# Patient Record
Sex: Female | Born: 1945 | Race: White | Hispanic: No | Smoking: Former smoker
Health system: Southern US, Community
[De-identification: ages and names within clinical notes are randomized; demographics above are authoritative.]

## PROBLEM LIST (undated history)

## (undated) DIAGNOSIS — I10 Essential (primary) hypertension: Secondary | ICD-10-CM

## (undated) DIAGNOSIS — E119 Type 2 diabetes mellitus without complications: Secondary | ICD-10-CM

## (undated) DIAGNOSIS — G4730 Sleep apnea, unspecified: Secondary | ICD-10-CM

## (undated) DIAGNOSIS — C801 Malignant (primary) neoplasm, unspecified: Secondary | ICD-10-CM

## (undated) DIAGNOSIS — T8859XA Other complications of anesthesia, initial encounter: Secondary | ICD-10-CM

## (undated) DIAGNOSIS — M659 Unspecified synovitis and tenosynovitis, unspecified site: Secondary | ICD-10-CM

## (undated) DIAGNOSIS — Z972 Presence of dental prosthetic device (complete) (partial): Secondary | ICD-10-CM

## (undated) DIAGNOSIS — R42 Dizziness and giddiness: Secondary | ICD-10-CM

## (undated) DIAGNOSIS — M1990 Unspecified osteoarthritis, unspecified site: Secondary | ICD-10-CM

## (undated) DIAGNOSIS — I4891 Unspecified atrial fibrillation: Secondary | ICD-10-CM

## (undated) DIAGNOSIS — E781 Pure hyperglyceridemia: Secondary | ICD-10-CM

## (undated) DIAGNOSIS — T4145XA Adverse effect of unspecified anesthetic, initial encounter: Secondary | ICD-10-CM

---
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
## 2004-02-08 ENCOUNTER — Ambulatory Visit: Payer: Self-pay | Admitting: Gynecology

## 2004-04-29 ENCOUNTER — Ambulatory Visit: Payer: Self-pay | Admitting: Family Medicine

## 2005-05-05 ENCOUNTER — Ambulatory Visit: Payer: Self-pay | Admitting: Family Medicine

## 2006-05-11 ENCOUNTER — Ambulatory Visit: Payer: Self-pay | Admitting: Family Medicine

## 2007-05-13 ENCOUNTER — Ambulatory Visit: Payer: Self-pay | Admitting: Family Medicine

## 2007-06-05 ENCOUNTER — Ambulatory Visit: Payer: Self-pay | Admitting: Family Medicine

## 2008-05-15 ENCOUNTER — Ambulatory Visit: Payer: Self-pay | Admitting: Family Medicine

## 2009-07-26 ENCOUNTER — Ambulatory Visit: Payer: Self-pay | Admitting: Family Medicine

## 2010-07-30 ENCOUNTER — Ambulatory Visit: Payer: Self-pay | Admitting: Family Medicine

## 2011-08-01 ENCOUNTER — Ambulatory Visit: Payer: Self-pay | Admitting: Family Medicine

## 2012-08-03 ENCOUNTER — Ambulatory Visit: Payer: Self-pay | Admitting: Family Medicine

## 2012-09-08 ENCOUNTER — Ambulatory Visit: Payer: Self-pay | Admitting: Specialist

## 2012-09-08 LAB — APTT: Activated PTT: 30 secs (ref 23.6–35.9)

## 2012-09-08 LAB — IRON AND TIBC
Iron Bind.Cap.(Total): 422 ug/dL (ref 250–450)
Unbound Iron-Bind.Cap.: 367 ug/dL

## 2012-09-08 LAB — CBC WITH DIFFERENTIAL/PLATELET
Basophil #: 0.1 10*3/uL (ref 0.0–0.1)
Eosinophil %: 2.8 %
HCT: 37 % (ref 35.0–47.0)
Lymphocyte #: 1.4 10*3/uL (ref 1.0–3.6)
Lymphocyte %: 21.8 %
MCH: 29.6 pg (ref 26.0–34.0)
MCHC: 33.2 g/dL (ref 32.0–36.0)
Monocyte %: 7.7 %
Neutrophil #: 4.4 10*3/uL (ref 1.4–6.5)
Neutrophil %: 66.9 %
WBC: 6.6 10*3/uL (ref 3.6–11.0)

## 2012-09-08 LAB — COMPREHENSIVE METABOLIC PANEL
Albumin: 3.8 g/dL (ref 3.4–5.0)
Alkaline Phosphatase: 63 U/L (ref 50–136)
Anion Gap: 11 (ref 7–16)
BUN: 15 mg/dL (ref 7–18)
Chloride: 104 mmol/L (ref 98–107)
Creatinine: 0.72 mg/dL (ref 0.60–1.30)
EGFR (Non-African Amer.): >60
SGPT (ALT): 24 U/L (ref 12–78)
Total Protein: 8.2 g/dL (ref 6.4–8.2)

## 2012-09-08 LAB — PROTIME-INR
INR: 1
Prothrombin Time: 13.3 secs (ref 11.5–14.7)

## 2012-09-08 LAB — LIPASE, BLOOD: Lipase: 135 U/L (ref 73–393)

## 2012-09-08 LAB — HEMOGLOBIN A1C: Hemoglobin A1C: 5.9 % (ref 4.2–6.3)

## 2012-09-08 LAB — MAGNESIUM: Magnesium: 2.1 mg/dL

## 2012-09-08 LAB — AMYLASE: Amylase: 17 U/L — ABNORMAL LOW (ref 25–115)

## 2012-09-08 LAB — TSH: Thyroid Stimulating Horm: 2.29 u[IU]/mL

## 2012-09-08 LAB — FOLATE: Folic Acid: 16.1 ng/mL (ref 3.1–100.0)

## 2012-10-12 ENCOUNTER — Ambulatory Visit: Payer: Self-pay | Admitting: Specialist

## 2012-11-03 ENCOUNTER — Ambulatory Visit: Payer: Self-pay | Admitting: Specialist

## 2012-12-30 ENCOUNTER — Ambulatory Visit: Payer: Self-pay | Admitting: Specialist

## 2013-01-24 ENCOUNTER — Ambulatory Visit: Payer: Self-pay | Admitting: Specialist

## 2013-02-24 HISTORY — PX: LAPAROSCOPIC GASTRIC RESECTION: SHX1936

## 2013-08-16 ENCOUNTER — Ambulatory Visit: Payer: Self-pay | Admitting: Family Medicine

## 2014-09-20 ENCOUNTER — Other Ambulatory Visit: Payer: Self-pay | Admitting: Family Medicine

## 2014-09-20 DIAGNOSIS — Z1231 Encounter for screening mammogram for malignant neoplasm of breast: Secondary | ICD-10-CM

## 2014-10-03 ENCOUNTER — Ambulatory Visit
Admission: RE | Admit: 2014-10-03 | Discharge: 2014-10-03 | Disposition: A | Discharge status: Home / Self Care | Payer: Medicare Other | Source: Ambulatory Visit | Attending: Family Medicine | Admitting: Family Medicine

## 2014-10-03 DIAGNOSIS — Z1231 Encounter for screening mammogram for malignant neoplasm of breast: Secondary | ICD-10-CM | POA: Insufficient documentation

## 2015-03-27 ENCOUNTER — Other Ambulatory Visit: Payer: Self-pay | Admitting: Obstetrics and Gynecology

## 2015-03-27 DIAGNOSIS — N6320 Unspecified lump in the left breast, unspecified quadrant: Secondary | ICD-10-CM

## 2015-03-27 DIAGNOSIS — N63 Unspecified lump in unspecified breast: Secondary | ICD-10-CM

## 2015-03-29 ENCOUNTER — Ambulatory Visit
Admission: RE | Admit: 2015-03-29 | Discharge: 2015-03-29 | Disposition: A | Discharge status: Home / Self Care | Payer: Medicare Other | Source: Ambulatory Visit | Attending: Obstetrics and Gynecology | Admitting: Obstetrics and Gynecology

## 2015-03-29 ENCOUNTER — Other Ambulatory Visit: Payer: Self-pay | Admitting: Obstetrics and Gynecology

## 2015-03-29 DIAGNOSIS — N63 Unspecified lump in breast: Secondary | ICD-10-CM | POA: Diagnosis present

## 2015-03-29 DIAGNOSIS — N6320 Unspecified lump in the left breast, unspecified quadrant: Secondary | ICD-10-CM

## 2015-03-29 DIAGNOSIS — R928 Other abnormal and inconclusive findings on diagnostic imaging of breast: Secondary | ICD-10-CM | POA: Diagnosis not present

## 2015-08-24 ENCOUNTER — Other Ambulatory Visit: Payer: Self-pay | Admitting: Family Medicine

## 2015-08-24 DIAGNOSIS — Z1231 Encounter for screening mammogram for malignant neoplasm of breast: Secondary | ICD-10-CM

## 2015-10-08 ENCOUNTER — Ambulatory Visit: Payer: Medicare Other

## 2015-10-09 ENCOUNTER — Ambulatory Visit
Admission: RE | Admit: 2015-10-09 | Discharge: 2015-10-09 | Disposition: A | Discharge status: Home / Self Care | Payer: Medicare Other | Source: Ambulatory Visit | Attending: Family Medicine | Admitting: Family Medicine

## 2015-10-09 ENCOUNTER — Other Ambulatory Visit: Payer: Self-pay | Admitting: Family Medicine

## 2015-10-09 DIAGNOSIS — Z1231 Encounter for screening mammogram for malignant neoplasm of breast: Secondary | ICD-10-CM | POA: Insufficient documentation

## 2016-08-29 ENCOUNTER — Other Ambulatory Visit: Payer: Self-pay | Admitting: Family Medicine

## 2016-08-29 DIAGNOSIS — Z1231 Encounter for screening mammogram for malignant neoplasm of breast: Secondary | ICD-10-CM

## 2016-10-13 ENCOUNTER — Ambulatory Visit
Admission: RE | Admit: 2016-10-13 | Discharge: 2016-10-13 | Disposition: A | Discharge status: Home / Self Care | Payer: Medicare Other | Source: Ambulatory Visit | Attending: Family Medicine | Admitting: Family Medicine

## 2016-10-13 DIAGNOSIS — Z1231 Encounter for screening mammogram for malignant neoplasm of breast: Secondary | ICD-10-CM

## 2017-11-18 ENCOUNTER — Other Ambulatory Visit: Payer: Self-pay | Admitting: Family Medicine

## 2017-11-18 DIAGNOSIS — Z1231 Encounter for screening mammogram for malignant neoplasm of breast: Secondary | ICD-10-CM

## 2017-12-28 ENCOUNTER — Ambulatory Visit
Admission: RE | Admit: 2017-12-28 | Discharge: 2017-12-28 | Disposition: A | Discharge status: Home / Self Care | Payer: Medicare Other | Source: Ambulatory Visit | Attending: Family Medicine | Admitting: Family Medicine

## 2017-12-28 DIAGNOSIS — Z1231 Encounter for screening mammogram for malignant neoplasm of breast: Secondary | ICD-10-CM | POA: Insufficient documentation

## 2017-12-28 HISTORY — DX: Malignant (primary) neoplasm, unspecified: C80.1

## 2018-01-08 ENCOUNTER — Other Ambulatory Visit: Payer: Self-pay | Admitting: Unknown Physician Specialty

## 2018-01-08 DIAGNOSIS — E119 Type 2 diabetes mellitus without complications: Secondary | ICD-10-CM

## 2018-01-08 DIAGNOSIS — M6530 Trigger finger, unspecified finger: Secondary | ICD-10-CM

## 2018-02-02 ENCOUNTER — Other Ambulatory Visit: Payer: Self-pay | Admitting: Unknown Physician Specialty

## 2018-02-02 ENCOUNTER — Encounter (INDEPENDENT_AMBULATORY_CARE_PROVIDER_SITE_OTHER): Payer: Self-pay

## 2018-02-02 ENCOUNTER — Other Ambulatory Visit
Admission: RE | Admit: 2018-02-02 | Discharge: 2018-02-02 | Disposition: A | Discharge status: Home / Self Care | Payer: Medicare Other | Source: Home / Self Care | Attending: Unknown Physician Specialty | Admitting: Unknown Physician Specialty

## 2018-02-02 ENCOUNTER — Ambulatory Visit
Admission: RE | Admit: 2018-02-02 | Discharge: 2018-02-02 | Disposition: A | Discharge status: Home / Self Care | Payer: Medicare Other | Source: Ambulatory Visit | Attending: Unknown Physician Specialty | Admitting: Unknown Physician Specialty

## 2018-02-02 DIAGNOSIS — E119 Type 2 diabetes mellitus without complications: Secondary | ICD-10-CM

## 2018-02-02 DIAGNOSIS — M19042 Primary osteoarthritis, left hand: Secondary | ICD-10-CM | POA: Diagnosis not present

## 2018-02-02 DIAGNOSIS — M6530 Trigger finger, unspecified finger: Secondary | ICD-10-CM | POA: Diagnosis present

## 2018-02-02 LAB — CREATININE, SERUM: Creatinine, Ser: 0.48 mg/dL (ref 0.44–1.00)

## 2018-02-02 LAB — BUN: BUN: 15 mg/dL (ref 8–23)

## 2019-02-03 ENCOUNTER — Other Ambulatory Visit: Payer: Self-pay | Admitting: Family Medicine

## 2019-02-03 DIAGNOSIS — Z1231 Encounter for screening mammogram for malignant neoplasm of breast: Secondary | ICD-10-CM

## 2019-02-09 ENCOUNTER — Ambulatory Visit
Admission: RE | Admit: 2019-02-09 | Discharge: 2019-02-09 | Disposition: A | Discharge status: Home / Self Care | Payer: Medicare Other | Source: Ambulatory Visit | Attending: Family Medicine | Admitting: Family Medicine

## 2019-02-09 ENCOUNTER — Other Ambulatory Visit: Payer: Self-pay

## 2019-02-09 DIAGNOSIS — Z1231 Encounter for screening mammogram for malignant neoplasm of breast: Secondary | ICD-10-CM | POA: Insufficient documentation

## 2019-09-20 ENCOUNTER — Encounter: Payer: Self-pay | Admitting: Ophthalmology

## 2019-09-20 ENCOUNTER — Other Ambulatory Visit: Payer: Self-pay

## 2019-09-22 ENCOUNTER — Other Ambulatory Visit
Admission: RE | Admit: 2019-09-22 | Discharge: 2019-09-22 | Disposition: A | Discharge status: Home / Self Care | Payer: Medicare Other | Source: Ambulatory Visit | Attending: Ophthalmology | Admitting: Ophthalmology

## 2019-09-22 ENCOUNTER — Other Ambulatory Visit: Payer: Self-pay

## 2019-09-22 DIAGNOSIS — Z01812 Encounter for preprocedural laboratory examination: Secondary | ICD-10-CM | POA: Diagnosis present

## 2019-09-22 DIAGNOSIS — Z20822 Contact with and (suspected) exposure to covid-19: Secondary | ICD-10-CM | POA: Insufficient documentation

## 2019-09-22 LAB — SARS CORONAVIRUS 2 (TAT 6-24 HRS): SARS Coronavirus 2: NEGATIVE

## 2019-09-22 NOTE — Discharge Instructions (Signed)

## 2019-09-26 ENCOUNTER — Ambulatory Visit: Payer: Medicare Other | Admitting: Anesthesiology

## 2019-09-26 ENCOUNTER — Encounter: Payer: Self-pay | Admitting: Ophthalmology

## 2019-09-26 ENCOUNTER — Other Ambulatory Visit: Payer: Self-pay

## 2019-09-26 ENCOUNTER — Encounter
Admission: RE | Disposition: A | Discharge status: Home / Self Care | Payer: Self-pay | Source: Home / Self Care | Attending: Ophthalmology

## 2019-09-26 ENCOUNTER — Ambulatory Visit
Admission: RE | Admit: 2019-09-26 | Discharge: 2019-09-26 | Disposition: A | Discharge status: Home / Self Care | Payer: Medicare Other | Attending: Ophthalmology | Admitting: Ophthalmology

## 2019-09-26 DIAGNOSIS — Z79899 Other long term (current) drug therapy: Secondary | ICD-10-CM | POA: Diagnosis not present

## 2019-09-26 DIAGNOSIS — I480 Paroxysmal atrial fibrillation: Secondary | ICD-10-CM | POA: Diagnosis not present

## 2019-09-26 DIAGNOSIS — Z7984 Long term (current) use of oral hypoglycemic drugs: Secondary | ICD-10-CM | POA: Insufficient documentation

## 2019-09-26 DIAGNOSIS — I10 Essential (primary) hypertension: Secondary | ICD-10-CM | POA: Insufficient documentation

## 2019-09-26 DIAGNOSIS — E1136 Type 2 diabetes mellitus with diabetic cataract: Secondary | ICD-10-CM | POA: Insufficient documentation

## 2019-09-26 DIAGNOSIS — G4730 Sleep apnea, unspecified: Secondary | ICD-10-CM | POA: Diagnosis not present

## 2019-09-26 DIAGNOSIS — H2511 Age-related nuclear cataract, right eye: Secondary | ICD-10-CM | POA: Insufficient documentation

## 2019-09-26 DIAGNOSIS — Z7901 Long term (current) use of anticoagulants: Secondary | ICD-10-CM | POA: Insufficient documentation

## 2019-09-26 HISTORY — DX: Type 2 diabetes mellitus without complications: E11.9

## 2019-09-26 HISTORY — DX: Other complications of anesthesia, initial encounter: T88.59XA

## 2019-09-26 HISTORY — DX: Unspecified osteoarthritis, unspecified site: M19.90

## 2019-09-26 HISTORY — DX: Presence of dental prosthetic device (complete) (partial): Z97.2

## 2019-09-26 HISTORY — DX: Pure hyperglyceridemia: E78.1

## 2019-09-26 HISTORY — DX: Unspecified atrial fibrillation: I48.91

## 2019-09-26 HISTORY — PX: CATARACT EXTRACTION W/PHACO: SHX586

## 2019-09-26 HISTORY — DX: Sleep apnea, unspecified: G47.30

## 2019-09-26 HISTORY — DX: Unspecified synovitis and tenosynovitis, unspecified site: M65.90

## 2019-09-26 HISTORY — DX: Dizziness and giddiness: R42

## 2019-09-26 HISTORY — DX: Essential (primary) hypertension: I10

## 2019-09-26 HISTORY — DX: Synovitis and tenosynovitis, unspecified: M65.9

## 2019-09-26 LAB — GLUCOSE, CAPILLARY
Glucose-Capillary: 121 mg/dL — ABNORMAL HIGH (ref 70–99)
Glucose-Capillary: 113 mg/dL — ABNORMAL HIGH (ref 70–99)

## 2019-09-26 SURGERY — PHACOEMULSIFICATION, CATARACT, WITH IOL INSERTION
Anesthesia: Monitor Anesthesia Care | Site: Eye | Laterality: Right

## 2019-09-26 MED ORDER — MIDAZOLAM HCL 2 MG/2ML IJ SOLN
INTRAMUSCULAR | Status: DC | PRN
  Administered 2019-09-26: 1 mg via INTRAVENOUS

## 2019-09-26 MED ORDER — ACETAMINOPHEN 160 MG/5ML PO SOLN: 325.0000 mg | ORAL | Status: DC | PRN

## 2019-09-26 MED ORDER — LIDOCAINE HCL (PF) 2 % IJ SOLN
INTRAOCULAR | Status: DC | PRN
  Administered 2019-09-26: 1 mL via INTRAOCULAR

## 2019-09-26 MED ORDER — ACETAMINOPHEN 325 MG PO TABS: 650.0000 mg | ORAL_TABLET | Freq: Once | ORAL | Status: DC | PRN

## 2019-09-26 MED ORDER — TETRACAINE HCL 0.5 % OP SOLN
1.0000 [drp] | OPHTHALMIC | Status: DC | PRN
  Administered 2019-09-26 (×3): 1 [drp] via OPHTHALMIC

## 2019-09-26 MED ORDER — EPINEPHRINE PF 1 MG/ML IJ SOLN
INTRAOCULAR | Status: DC | PRN
  Administered 2019-09-26: 101 mL via OPHTHALMIC

## 2019-09-26 MED ORDER — ONDANSETRON HCL 4 MG/2ML IJ SOLN: 4.0000 mg | Freq: Once | INTRAMUSCULAR | Status: DC | PRN

## 2019-09-26 MED ORDER — MOXIFLOXACIN HCL 0.5 % OP SOLN
OPHTHALMIC | Status: DC | PRN
  Administered 2019-09-26: 0.2 mL via OPHTHALMIC

## 2019-09-26 MED ORDER — FENTANYL CITRATE (PF) 100 MCG/2ML IJ SOLN
INTRAMUSCULAR | Status: DC | PRN
  Administered 2019-09-26: 50 ug via INTRAVENOUS

## 2019-09-26 MED ORDER — ARMC OPHTHALMIC DILATING DROPS
1.0000 "application " | OPHTHALMIC | Status: DC | PRN
  Administered 2019-09-26 (×3): 1 via OPHTHALMIC

## 2019-09-26 MED ORDER — SODIUM HYALURONATE 23 MG/ML IO SOLN
INTRAOCULAR | Status: DC | PRN
  Administered 2019-09-26: 0.6 mL via INTRAOCULAR

## 2019-09-26 MED ORDER — SODIUM HYALURONATE 10 MG/ML IO SOLN
INTRAOCULAR | Status: DC | PRN
  Administered 2019-09-26: 0.55 mL via INTRAOCULAR

## 2019-09-26 MED ORDER — LACTATED RINGERS IV SOLN: INTRAVENOUS | Status: DC

## 2019-09-26 SURGICAL SUPPLY — 19 items
CANNULA ANT/CHMB 27G (MISCELLANEOUS) ×2 IMPLANT
CANNULA ANT/CHMB 27GA (MISCELLANEOUS) ×6 IMPLANT
DISSECTOR HYDRO NUCLEUS 50X22 (MISCELLANEOUS) ×3 IMPLANT
GLOVE SURG LX 7.5 STRW (GLOVE) ×4
GLOVE SURG LX STRL 7.5 STRW (GLOVE) ×1 IMPLANT
GLOVE SURG SYN 8.5  E (GLOVE) ×2
GLOVE SURG SYN 8.5 E (GLOVE) ×1 IMPLANT
GLOVE SURG SYN 8.5 PF PI (GLOVE) ×1 IMPLANT
GOWN STRL REUS W/ TWL LRG LVL3 (GOWN DISPOSABLE) ×2 IMPLANT
GOWN STRL REUS W/TWL LRG LVL3 (GOWN DISPOSABLE) ×6
LENS IOL TECNIS 27.0 (Intraocular Lens) ×2 IMPLANT
MARKER SKIN DUAL TIP RULER LAB (MISCELLANEOUS) ×3 IMPLANT
PACK DR. KING ARMS (PACKS) ×3 IMPLANT
PACK EYE AFTER SURG (MISCELLANEOUS) ×3 IMPLANT
PACK OPTHALMIC (MISCELLANEOUS) ×3 IMPLANT
SYR 3ML LL SCALE MARK (SYRINGE) ×3 IMPLANT
SYR TB 1ML LUER SLIP (SYRINGE) ×3 IMPLANT
WATER STERILE IRR 250ML POUR (IV SOLUTION) ×3 IMPLANT
WIPE NON LINTING 3.25X3.25 (MISCELLANEOUS) ×3 IMPLANT

## 2019-09-26 NOTE — Anesthesia Procedure Notes (Signed)
Procedure Name: MAC Date/Time: 09/26/2019 10:55 AM Performed by: Silvana Newness, CRNA Pre-anesthesia Checklist: Patient identified, Emergency Drugs available, Suction available, Patient being monitored and Timeout performed Patient Re-evaluated:Patient Re-evaluated prior to induction Oxygen Delivery Method: Nasal cannula Placement Confirmation: positive ETCO2

## 2019-09-26 NOTE — Transfer of Care (Signed)
Immediate Anesthesia Transfer of Care Note  Patient: Tina Butler  Procedure(s) Performed: CATARACT EXTRACTION PHACO AND INTRAOCULAR LENS PLACEMENT (IOC) RIGHT DIABETIC (Right Eye)  Patient Location: PACU  Anesthesia Type: MAC  Level of Consciousness: awake, alert  and patient cooperative  Airway and Oxygen Therapy: Patient Spontanous Breathing and Patient connected to supplemental oxygen  Post-op Assessment: Post-op Vital signs reviewed, Patient's Cardiovascular Status Stable, Respiratory Function Stable, Patent Airway and No signs of Nausea or vomiting  Post-op Vital Signs: Reviewed and stable  Complications: No complications documented.

## 2019-09-26 NOTE — Anesthesia Preprocedure Evaluation (Addendum)
Anesthesia Evaluation  Patient identified by MRN, date of birth, ID band Patient awake    Reviewed: Allergy & Precautions, NPO status , Patient's Chart, lab work & pertinent test results  History of Anesthesia Complications Negative for: history of anesthetic complications  Airway Mallampati: III       Dental  (+) Partial Lower, Partial Upper   Pulmonary sleep apnea , former smoker (quit 1980),    Pulmonary exam normal breath sounds clear to auscultation       Cardiovascular hypertension, + dysrhythmias (a fib on Eliquis)  Rhythm:Irregular Rate:Normal     Neuro/Psych Vertigo, HOH    GI/Hepatic negative GI ROS,   Endo/Other  diabetes  Renal/GU negative Renal ROS     Musculoskeletal  (+) Arthritis ,   Abdominal   Peds  Hematology negative hematology ROS (+)   Anesthesia Other Findings Cardiology note 07/22/19:  Assessment and Plan   74 y.o. female with  ICD-10-CM ICD-9-CM  1. Paroxysmal atrial fibrillation-atrial fibrillation is paroxysmal. Clinically she is in A. fib today. Rate is well controlled. Holter showed approximately a 16% burden. She denies syncope. We discussed at length consideration for ablation versus antiarrhythmic therapy versus continue treatment as we are. Plan is to continue as well for now. I48.0 427.31  2. Essential hypertension-blood pressure is in good control with diet alone. Will continue with this and follow I10 401.9  3. Hypertriglyceridemia-continue with diet and exercise. Her LDL was 85. E78.1 272.1  4. Diabetes mellitus type 2, uncomplicated-continue with metformin and have a hemoglobin A1c goal of less than 6. Most recent value was 6.7. Strict adherence to an ADA diet was discussed E11.9 250.00   Return in about 3 months (around 10/22/2019).   Reproductive/Obstetrics                            Anesthesia Physical Anesthesia Plan  ASA: III  Anesthesia  Plan: MAC   Post-op Pain Management:    Induction: Intravenous  PONV Risk Score and Plan: 2 and TIVA, Midazolam and Treatment may vary due to age or medical condition  Airway Management Planned: Nasal Cannula  Additional Equipment:   Intra-op Plan:   Post-operative Plan:   Informed Consent: I have reviewed the patients History and Physical, chart, labs and discussed the procedure including the risks, benefits and alternatives for the proposed anesthesia with the patient or authorized representative who has indicated his/her understanding and acceptance.       Plan Discussed with: CRNA  Anesthesia Plan Comments:        Anesthesia Quick Evaluation

## 2019-09-26 NOTE — Op Note (Signed)
OPERATIVE NOTE  Tina Butler [id number] 09/26/2019   PREOPERATIVE DIAGNOSIS:  Nuclear sclerotic cataract right eye.  H25.11   POSTOPERATIVE DIAGNOSIS:    Nuclear sclerotic cataract right eye.     PROCEDURE:  Phacoemusification with posterior chamber intraocular lens placement of the right eye   LENS:   Implant Name Type Inv. Item Serial No. Manufacturer Lot No. LRB No. Used Action  LENS IOL TECNIS 27.0 - [id number] Intraocular Lens LENS IOL TECNIS 27.0 [id number] [employer]  Right 1 Implanted       Procedure(s) with comments: CATARACT EXTRACTION PHACO AND INTRAOCULAR LENS PLACEMENT (IOC) RIGHT DIABETIC (Right) - 8.54 0:55.4  DCB00 +27.0   ULTRASOUND TIME: 0 minutes 55 seconds.  CDE 8.54   SURGEON:  Benay Pillow, MD, MPH  ANESTHESIOLOGIST: Anesthesiologist: Darrin Nipper, MD CRNA: Silvana Newness, CRNA   ANESTHESIA:  Topical with tetracaine drops augmented with 1% preservative-free intracameral lidocaine.  ESTIMATED BLOOD LOSS: less than 1 mL.   COMPLICATIONS:  None.   DESCRIPTION OF PROCEDURE:  The patient was identified in the holding room and transported to the operating room and placed in the supine position under the operating microscope.  The right eye was identified as the operative eye and it was prepped and draped in the usual sterile ophthalmic fashion.   A 1.0 millimeter clear-corneal paracentesis was made at the 10:30 position. 0.5 ml of preservative-free 1% lidocaine with epinephrine was injected into the anterior chamber.  The anterior chamber was filled with Healon 5 viscoelastic.  A 2.4 millimeter keratome was used to make a near-clear corneal incision at the 8:00 position.  A curvilinear capsulorrhexis was made with a cystotome and capsulorrhexis forceps.  Balanced salt solution was used to hydrodissect and hydrodelineate the nucleus.   Phacoemulsification was then used in stop and chop fashion to remove the lens nucleus and epinucleus.  The  remaining cortex was then removed using the irrigation and aspiration handpiece. Healon was then placed into the capsular bag to distend it for lens placement.  A lens was then injected into the capsular bag.  The remaining viscoelastic was aspirated.   Wounds were hydrated with balanced salt solution.  The anterior chamber was inflated to a physiologic pressure with balanced salt solution.   Intracameral vigamox 0.1 mL undiluted was injected into the eye and a drop placed onto the ocular surface.  No wound leaks were noted.  The patient was taken to the recovery room in stable condition without complications of anesthesia or surgery  Benay Pillow 09/26/2019, 11:11 AM

## 2019-09-26 NOTE — Anesthesia Postprocedure Evaluation (Signed)
Anesthesia Post Note  Patient: Tina Butler  Procedure(s) Performed: CATARACT EXTRACTION PHACO AND INTRAOCULAR LENS PLACEMENT (IOC) RIGHT DIABETIC (Right Eye)     Patient location during evaluation: PACU Anesthesia Type: MAC Level of consciousness: awake and alert, oriented and patient cooperative Pain management: pain level controlled Vital Signs Assessment: post-procedure vital signs reviewed and stable Respiratory status: spontaneous breathing, nonlabored ventilation and respiratory function stable Cardiovascular status: blood pressure returned to baseline and stable Postop Assessment: adequate PO intake Anesthetic complications: no   No complications documented.  Darrin Nipper

## 2019-09-26 NOTE — H&P (Signed)

## 2019-09-27 ENCOUNTER — Encounter: Payer: Self-pay | Admitting: Ophthalmology

## 2020-01-02 ENCOUNTER — Other Ambulatory Visit: Payer: Self-pay

## 2020-01-02 ENCOUNTER — Other Ambulatory Visit: Payer: Self-pay | Admitting: Family Medicine

## 2020-01-02 ENCOUNTER — Encounter: Payer: Self-pay | Admitting: Ophthalmology

## 2020-01-02 DIAGNOSIS — Z1231 Encounter for screening mammogram for malignant neoplasm of breast: Secondary | ICD-10-CM

## 2020-01-05 ENCOUNTER — Other Ambulatory Visit
Admission: RE | Admit: 2020-01-05 | Discharge: 2020-01-05 | Disposition: A | Discharge status: Home / Self Care | Payer: Medicare Other | Source: Ambulatory Visit | Attending: Ophthalmology | Admitting: Ophthalmology

## 2020-01-05 ENCOUNTER — Other Ambulatory Visit: Payer: Self-pay

## 2020-01-05 DIAGNOSIS — Z01812 Encounter for preprocedural laboratory examination: Secondary | ICD-10-CM | POA: Insufficient documentation

## 2020-01-05 DIAGNOSIS — Z20822 Contact with and (suspected) exposure to covid-19: Secondary | ICD-10-CM | POA: Insufficient documentation

## 2020-01-05 NOTE — Discharge Instructions (Signed)

## 2020-01-06 LAB — SARS CORONAVIRUS 2 (TAT 6-24 HRS): SARS Coronavirus 2: NEGATIVE

## 2020-01-09 ENCOUNTER — Other Ambulatory Visit: Payer: Self-pay

## 2020-01-09 ENCOUNTER — Ambulatory Visit
Admission: RE | Admit: 2020-01-09 | Discharge: 2020-01-09 | Disposition: A | Discharge status: Home / Self Care | Payer: Medicare Other | Attending: Ophthalmology | Admitting: Ophthalmology

## 2020-01-09 ENCOUNTER — Encounter: Payer: Self-pay | Admitting: Ophthalmology

## 2020-01-09 ENCOUNTER — Encounter
Admission: RE | Disposition: A | Discharge status: Home / Self Care | Payer: Self-pay | Source: Home / Self Care | Attending: Ophthalmology

## 2020-01-09 ENCOUNTER — Ambulatory Visit: Payer: Medicare Other | Admitting: Anesthesiology

## 2020-01-09 DIAGNOSIS — Z885 Allergy status to narcotic agent status: Secondary | ICD-10-CM | POA: Diagnosis not present

## 2020-01-09 DIAGNOSIS — Z881 Allergy status to other antibiotic agents status: Secondary | ICD-10-CM | POA: Diagnosis not present

## 2020-01-09 DIAGNOSIS — Z87891 Personal history of nicotine dependence: Secondary | ICD-10-CM | POA: Diagnosis not present

## 2020-01-09 DIAGNOSIS — Z7984 Long term (current) use of oral hypoglycemic drugs: Secondary | ICD-10-CM | POA: Diagnosis not present

## 2020-01-09 DIAGNOSIS — Z880 Allergy status to penicillin: Secondary | ICD-10-CM | POA: Insufficient documentation

## 2020-01-09 DIAGNOSIS — Z79899 Other long term (current) drug therapy: Secondary | ICD-10-CM | POA: Insufficient documentation

## 2020-01-09 DIAGNOSIS — Z7901 Long term (current) use of anticoagulants: Secondary | ICD-10-CM | POA: Diagnosis not present

## 2020-01-09 DIAGNOSIS — H2512 Age-related nuclear cataract, left eye: Secondary | ICD-10-CM | POA: Insufficient documentation

## 2020-01-09 DIAGNOSIS — Z888 Allergy status to other drugs, medicaments and biological substances status: Secondary | ICD-10-CM | POA: Diagnosis not present

## 2020-01-09 HISTORY — PX: CATARACT EXTRACTION W/PHACO: SHX586

## 2020-01-09 LAB — GLUCOSE, CAPILLARY
Glucose-Capillary: 129 mg/dL — ABNORMAL HIGH (ref 70–99)
Glucose-Capillary: 129 mg/dL — ABNORMAL HIGH (ref 70–99)

## 2020-01-09 SURGERY — PHACOEMULSIFICATION, CATARACT, WITH IOL INSERTION
Anesthesia: Monitor Anesthesia Care | Site: Eye | Laterality: Left

## 2020-01-09 MED ORDER — ACETAMINOPHEN 325 MG PO TABS: 325.0000 mg | ORAL_TABLET | ORAL | Status: DC | PRN

## 2020-01-09 MED ORDER — MOXIFLOXACIN HCL 0.5 % OP SOLN
OPHTHALMIC | Status: DC | PRN
  Administered 2020-01-09: 0.2 mL via OPHTHALMIC

## 2020-01-09 MED ORDER — TETRACAINE HCL 0.5 % OP SOLN
1.0000 [drp] | OPHTHALMIC | Status: DC | PRN
  Administered 2020-01-09 (×3): 1 [drp] via OPHTHALMIC

## 2020-01-09 MED ORDER — LIDOCAINE HCL (PF) 2 % IJ SOLN
INTRAOCULAR | Status: DC | PRN
  Administered 2020-01-09: 1 mL via INTRAOCULAR

## 2020-01-09 MED ORDER — SODIUM HYALURONATE 10 MG/ML IO SOLN
INTRAOCULAR | Status: DC | PRN
  Administered 2020-01-09: 0.55 mL via INTRAOCULAR

## 2020-01-09 MED ORDER — LACTATED RINGERS IV SOLN: INTRAVENOUS | Status: DC

## 2020-01-09 MED ORDER — FENTANYL CITRATE (PF) 100 MCG/2ML IJ SOLN
INTRAMUSCULAR | Status: DC | PRN
  Administered 2020-01-09: 50 ug via INTRAVENOUS

## 2020-01-09 MED ORDER — ACETAMINOPHEN 160 MG/5ML PO SOLN: 325.0000 mg | ORAL | Status: DC | PRN

## 2020-01-09 MED ORDER — SODIUM HYALURONATE 23 MG/ML IO SOLN
INTRAOCULAR | Status: DC | PRN
  Administered 2020-01-09: 0.6 mL via INTRAOCULAR

## 2020-01-09 MED ORDER — ARMC OPHTHALMIC DILATING DROPS
1.0000 "application " | OPHTHALMIC | Status: DC | PRN
  Administered 2020-01-09 (×3): 1 via OPHTHALMIC

## 2020-01-09 MED ORDER — MIDAZOLAM HCL 2 MG/2ML IJ SOLN
INTRAMUSCULAR | Status: DC | PRN
  Administered 2020-01-09: 2 mg via INTRAVENOUS

## 2020-01-09 MED ORDER — EPINEPHRINE PF 1 MG/ML IJ SOLN
INTRAOCULAR | Status: DC | PRN
  Administered 2020-01-09: 69 mL via OPHTHALMIC

## 2020-01-09 SURGICAL SUPPLY — 19 items
CANNULA ANT/CHMB 27G (MISCELLANEOUS) ×2 IMPLANT
CANNULA ANT/CHMB 27GA (MISCELLANEOUS) ×6 IMPLANT
DISSECTOR HYDRO NUCLEUS 50X22 (MISCELLANEOUS) ×3 IMPLANT
GLOVE SURG LX 7.5 STRW (GLOVE) ×4
GLOVE SURG LX STRL 7.5 STRW (GLOVE) ×1 IMPLANT
GLOVE SURG SYN 8.5  E (GLOVE) ×2
GLOVE SURG SYN 8.5 E (GLOVE) ×1 IMPLANT
GLOVE SURG SYN 8.5 PF PI (GLOVE) ×1 IMPLANT
GOWN STRL REUS W/ TWL LRG LVL3 (GOWN DISPOSABLE) ×2 IMPLANT
GOWN STRL REUS W/TWL LRG LVL3 (GOWN DISPOSABLE) ×6
LENS IOL TECNIS EYHANCE 31.5 (Intraocular Lens) ×2 IMPLANT
MARKER SKIN DUAL TIP RULER LAB (MISCELLANEOUS) ×3 IMPLANT
PACK DR. KING ARMS (PACKS) ×3 IMPLANT
PACK EYE AFTER SURG (MISCELLANEOUS) ×3 IMPLANT
PACK OPTHALMIC (MISCELLANEOUS) ×3 IMPLANT
SYR 3ML LL SCALE MARK (SYRINGE) ×3 IMPLANT
SYR TB 1ML LUER SLIP (SYRINGE) ×3 IMPLANT
WATER STERILE IRR 250ML POUR (IV SOLUTION) ×3 IMPLANT
WIPE NON LINTING 3.25X3.25 (MISCELLANEOUS) ×3 IMPLANT

## 2020-01-09 NOTE — H&P (Signed)
[facility]   Primary Care Physician:  Sofie Hartigan, MD Ophthalmologist: Dr. Benay Pillow  Pre-Procedure History & Physical: HPI:  Tina Butler is a 74 y.o. female here for cataract surgery.   Past Medical History:  Diagnosis Date  . Arthritis    back, fingers  . Atrial fibrillation ([place])   . Cancer ([place])    basal cell ca on face  . Complication of anesthesia    slow to wake  . Diabetes mellitus, type 2 ([place])   . Hypertension   . Hypertriglyceridemia   . Sleep apnea    has CPAP, does not use  . Tenosynovitis    Left index finger  . Vertigo    with sudden head turns  . Wears dentures    partial upper and lower    Past Surgical History:  Procedure Laterality Date  . CATARACT EXTRACTION W/PHACO Right 09/26/2019   Procedure: CATARACT EXTRACTION PHACO AND INTRAOCULAR LENS PLACEMENT ([place]) RIGHT DIABETIC;  Surgeon: Eulogio Bear, MD;  Location: [place];  Service: Ophthalmology;  Laterality: Right;  8.54 0:55.4  . LAPAROSCOPIC GASTRIC RESECTION  2015    Prior to Admission medications   Medication Sig Start Date End Date Taking? Authorizing Provider  apixaban (ELIQUIS) 5 MG TABS tablet Take 5 mg by mouth 2 (two) times daily.   Yes [provider]  cetirizine (ZYRTEC) 10 MG tablet Take 10 mg by mouth daily.   Yes [provider]  Cholecalciferol (VITAMIN D3 PO) Take by mouth daily.   Yes [provider]  CRANBERRY PO Take by mouth daily.   Yes [provider]  diltiazem (DILACOR XR) 120 MG 24 hr capsule Take 120 mg by mouth daily.   Yes [provider]  melatonin 5 MG TABS Take 5 mg by mouth at bedtime as needed.   Yes [provider]  metFORMIN (GLUCOPHAGE) 850 MG tablet Take 850 mg by mouth 2 (two) times daily with a meal.   Yes [provider]  naproxen (NAPROSYN) 500 MG tablet Take 500 mg by mouth daily.   Yes [provider]  rosuvastatin (CRESTOR) 5 MG tablet Take 5 mg by  mouth daily.   Yes [provider]    Allergies as of 09/28/2019 - Review Complete 09/26/2019  Allergen Reaction Noted  . Codeine Nausea And Vomiting 09/20/2019  . Lisinopril Cough 09/20/2019  . Amoxicillin Itching and Rash 09/20/2019  . Augmentin [amoxicillin-pot clavulanate] Itching and Rash 09/20/2019  . Penicillins Rash 09/20/2019    Family History  Problem Relation Age of Onset  . Breast cancer Mother 40  . Breast cancer Father 97  . Breast cancer Paternal Aunt     Social History   Socioeconomic History  . Marital status: Widowed    Spouse name: Not on file  . Number of children: Not on file  . Years of education: Not on file  . Highest education level: Not on file  Occupational History  . Not on file  Tobacco Use  . Smoking status: Former Smoker    Quit date: 1980    Years since quitting: 41.9  . Smokeless tobacco: Never Used  Vaping Use  . Vaping Use: Never used  Substance and Sexual Activity  . Alcohol use: Not Currently  . Drug use: Not on file  . Sexual activity: Not on file  Other Topics Concern  . Not on file  Social History Narrative  . Not on file   Social Determinants  of Health   Financial Resource Strain:   . Difficulty of Paying Living Expenses: Not on file  Food Insecurity:   . Worried About Charity fundraiser in the Last Year: Not on file  . Ran Out of Food in the Last Year: Not on file  Transportation Needs:   . Lack of Transportation (Medical): Not on file  . Lack of Transportation (Non-Medical): Not on file  Physical Activity:   . Days of Exercise per Week: Not on file  . Minutes of Exercise per Session: Not on file  Stress:   . Feeling of Stress : Not on file  Social Connections:   . Frequency of Communication with Friends and Family: Not on file  . Frequency of Social Gatherings with Friends and Family: Not on file  . Attends Religious Services: Not on file  . Active Member of Clubs or Organizations: Not on file  .  Attends Archivist Meetings: Not on file  . Marital Status: Not on file  Intimate Partner Violence:   . Fear of Current or Ex-Partner: Not on file  . Emotionally Abused: Not on file  . Physically Abused: Not on file  . Sexually Abused: Not on file    Review of Systems: See HPI, otherwise negative ROS  Physical Exam: BP (!) 163/70   Pulse 63   Temp (!) 97.3 F (36.3 C) (Temporal)   Ht 5' 1.5" (1.562 m)   Wt 89.4 kg   SpO2 95%   BMI 36.62 kg/m  General:   Alert,  pleasant and cooperative in NAD Head:  Normocephalic and atraumatic. Respiratory:  Normal work of breathing.  Impression/Plan: Tina Butler is here for cataract surgery.  Risks, benefits, limitations, and alternatives regarding cataract surgery have been reviewed with the patient.  Questions have been answered.  All parties agreeable.   Benay Pillow, MD  01/09/2020, 8:38 AM

## 2020-01-09 NOTE — Anesthesia Postprocedure Evaluation (Signed)
Anesthesia Post Note  Patient: Tina Butler  Procedure(s) Performed: CATARACT EXTRACTION PHACO AND INTRAOCULAR LENS PLACEMENT (IOC) LEFT DIABETIC (Left Eye)     Patient location during evaluation: PACU Anesthesia Type: MAC Level of consciousness: awake and alert Pain management: pain level controlled Vital Signs Assessment: post-procedure vital signs reviewed and stable Respiratory status: spontaneous breathing, nonlabored ventilation, respiratory function stable and patient connected to nasal cannula oxygen Cardiovascular status: stable and blood pressure returned to baseline Postop Assessment: no apparent nausea or vomiting Anesthetic complications: no   No complications documented.  Trecia Rogers

## 2020-01-09 NOTE — Transfer of Care (Signed)
Immediate Anesthesia Transfer of Care Note  Patient: Tina Butler  Procedure(s) Performed: CATARACT EXTRACTION PHACO AND INTRAOCULAR LENS PLACEMENT (IOC) LEFT DIABETIC (Left Eye)  Patient Location: PACU  Anesthesia Type: MAC  Level of Consciousness: awake, alert  and patient cooperative  Airway and Oxygen Therapy: Patient Spontanous Breathing and Patient connected to supplemental oxygen  Post-op Assessment: Post-op Vital signs reviewed, Patient's Cardiovascular Status Stable, Respiratory Function Stable, Patent Airway and No signs of Nausea or vomiting  Post-op Vital Signs: Reviewed and stable  Complications: No complications documented.

## 2020-01-09 NOTE — Anesthesia Preprocedure Evaluation (Signed)
Anesthesia Evaluation  Patient identified by MRN, date of birth, ID band Patient awake    Reviewed: Allergy & Precautions, H&P , NPO status , Patient's Chart, lab work & pertinent test results, reviewed documented beta blocker date and time   Airway Mallampati: II  TM Distance: >3 FB Neck ROM: full    Dental no notable dental hx.    Pulmonary sleep apnea , former smoker,    Pulmonary exam normal breath sounds clear to auscultation       Cardiovascular Exercise Tolerance: Good hypertension, Normal cardiovascular exam+ dysrhythmias Atrial Fibrillation  Rhythm:regular Rate:Normal     Neuro/Psych negative neurological ROS  negative psych ROS   GI/Hepatic negative GI ROS, Neg liver ROS,   Endo/Other  diabetes, Type 2, Oral Hypoglycemic Agents  Renal/GU negative Renal ROS  negative genitourinary   Musculoskeletal   Abdominal   Peds  Hematology negative hematology ROS (+)   Anesthesia Other Findings   Reproductive/Obstetrics negative OB ROS                             Anesthesia Physical Anesthesia Plan  ASA: III  Anesthesia Plan: MAC   Post-op Pain Management:    Induction:   PONV Risk Score and Plan:   Airway Management Planned:   Additional Equipment:   Intra-op Plan:   Post-operative Plan:   Informed Consent: I have reviewed the patients History and Physical, chart, labs and discussed the procedure including the risks, benefits and alternatives for the proposed anesthesia with the patient or authorized representative who has indicated his/her understanding and acceptance.     Dental Advisory Given  Plan Discussed with: CRNA  Anesthesia Plan Comments:         Anesthesia Quick Evaluation

## 2020-01-09 NOTE — Anesthesia Procedure Notes (Signed)
Procedure Name: MAC Date/Time: 01/09/2020 8:59 AM Performed by: Silvana Newness, CRNA Pre-anesthesia Checklist: Patient identified, Emergency Drugs available, Suction available, Patient being monitored and Timeout performed Patient Re-evaluated:Patient Re-evaluated prior to induction Oxygen Delivery Method: Nasal cannula Placement Confirmation: positive ETCO2

## 2020-01-09 NOTE — Op Note (Signed)
OPERATIVE NOTE  Tina Butler [id number] 01/09/2020   PREOPERATIVE DIAGNOSIS:  Nuclear sclerotic cataract left eye.  H25.12   POSTOPERATIVE DIAGNOSIS:    Nuclear sclerotic cataract left eye.     PROCEDURE:  Phacoemusification with posterior chamber intraocular lens placement of the left eye   LENS:   Implant Name Type Inv. Item Serial No. Manufacturer Lot No. LRB No. Used Action  LENS IOL TECNIS EYHANCE 31.5 - [id number] Intraocular Lens LENS IOL TECNIS EYHANCE 31.5 [id number] [employer]   Left 1 Implanted      Procedure(s) with comments: CATARACT EXTRACTION PHACO AND INTRAOCULAR LENS PLACEMENT (IOC) LEFT DIABETIC (Left) - 3.42 0:32.7  DIB00 +31.5   ULTRASOUND TIME: 0 minutes 32 seconds.  CDE 3.42   SURGEON:  Benay Pillow, MD, MPH   ANESTHESIA:  Topical with tetracaine drops augmented with 1% preservative-free intracameral lidocaine.  ESTIMATED BLOOD LOSS: <1 mL   COMPLICATIONS:  None.   DESCRIPTION OF PROCEDURE:  The patient was identified in the holding room and transported to the operating room and placed in the supine position under the operating microscope.  The left eye was identified as the operative eye and it was prepped and draped in the usual sterile ophthalmic fashion.   A 1.0 millimeter clear-corneal paracentesis was made at the 5:00 position. 0.5 ml of preservative-free 1% lidocaine with epinephrine was injected into the anterior chamber.  The anterior chamber was filled with Healon 5 viscoelastic.  A 2.4 millimeter keratome was used to make a near-clear corneal incision at the 2:00 position.  A curvilinear capsulorrhexis was made with a cystotome and capsulorrhexis forceps.  Balanced salt solution was used to hydrodissect and hydrodelineate the nucleus.   Phacoemulsification was then used in stop and chop fashion to remove the lens nucleus and epinucleus.  The remaining cortex was then removed using the irrigation and aspiration handpiece. Healon was then placed into  the capsular bag to distend it for lens placement.  A lens was then injected into the capsular bag.  The remaining viscoelastic was aspirated.   Wounds were hydrated with balanced salt solution.  The anterior chamber was inflated to a physiologic pressure with balanced salt solution.  Intracameral vigamox 0.1 mL undiltued was injected into the eye and a drop placed onto the ocular surface.  No wound leaks were noted.  The patient was taken to the recovery room in stable condition without complications of anesthesia or surgery  Benay Pillow 01/09/2020, 9:17 AM

## 2020-01-10 ENCOUNTER — Encounter: Payer: Self-pay | Admitting: Ophthalmology

## 2020-02-13 ENCOUNTER — Ambulatory Visit
Admission: RE | Admit: 2020-02-13 | Discharge: 2020-02-13 | Disposition: A | Discharge status: Home / Self Care | Payer: Medicare Other | Source: Ambulatory Visit | Attending: Family Medicine | Admitting: Family Medicine

## 2020-02-13 ENCOUNTER — Other Ambulatory Visit: Payer: Self-pay

## 2020-02-13 DIAGNOSIS — Z1231 Encounter for screening mammogram for malignant neoplasm of breast: Secondary | ICD-10-CM | POA: Diagnosis not present

## 2021-01-08 ENCOUNTER — Other Ambulatory Visit: Payer: Self-pay | Admitting: Family Medicine

## 2021-01-08 DIAGNOSIS — Z1231 Encounter for screening mammogram for malignant neoplasm of breast: Secondary | ICD-10-CM

## 2021-02-13 ENCOUNTER — Other Ambulatory Visit: Payer: Self-pay

## 2021-02-13 ENCOUNTER — Ambulatory Visit
Admission: RE | Admit: 2021-02-13 | Discharge: 2021-02-13 | Disposition: A | Discharge status: Home / Self Care | Payer: Medicare Other | Source: Ambulatory Visit | Attending: Family Medicine | Admitting: Family Medicine

## 2021-02-13 DIAGNOSIS — Z1231 Encounter for screening mammogram for malignant neoplasm of breast: Secondary | ICD-10-CM | POA: Insufficient documentation

## 2021-02-21 ENCOUNTER — Other Ambulatory Visit: Payer: Self-pay | Admitting: Family Medicine

## 2021-02-21 DIAGNOSIS — N6489 Other specified disorders of breast: Secondary | ICD-10-CM

## 2021-02-21 DIAGNOSIS — R928 Other abnormal and inconclusive findings on diagnostic imaging of breast: Secondary | ICD-10-CM

## 2021-02-28 ENCOUNTER — Ambulatory Visit
Admission: RE | Admit: 2021-02-28 | Discharge: 2021-02-28 | Disposition: A | Discharge status: Home / Self Care | Payer: Medicare Other | Source: Ambulatory Visit | Attending: Family Medicine | Admitting: Family Medicine

## 2021-02-28 ENCOUNTER — Other Ambulatory Visit: Payer: Self-pay

## 2021-02-28 DIAGNOSIS — R928 Other abnormal and inconclusive findings on diagnostic imaging of breast: Secondary | ICD-10-CM

## 2021-02-28 DIAGNOSIS — N6489 Other specified disorders of breast: Secondary | ICD-10-CM

## 2023-03-21 IMAGING — MG MM DIGITAL DIAGNOSTIC UNILAT*R* W/ TOMO W/ CAD
8 series · 9 of 24 positions shown · non-contrast
Comparison: Previous exam(s).

CLINICAL DATA: Patient recalled from screening for right breast
asymmetry.

EXAM:
DIGITAL DIAGNOSTIC UNILATERAL RIGHT MAMMOGRAM WITH TOMOSYNTHESIS AND
CAD
TECHNIQUE: Right digital diagnostic mammography and breast tomosynthesis was
performed. The images were evaluated with computer-aided detection.

[R ML synth-2D]
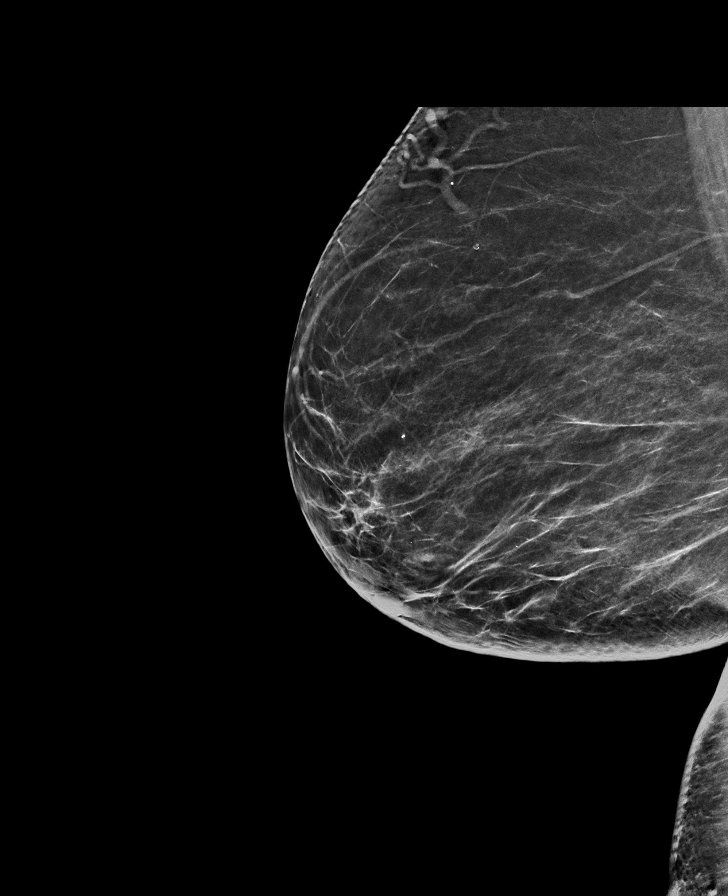

[R MLO synth-2D (1 of 3)]
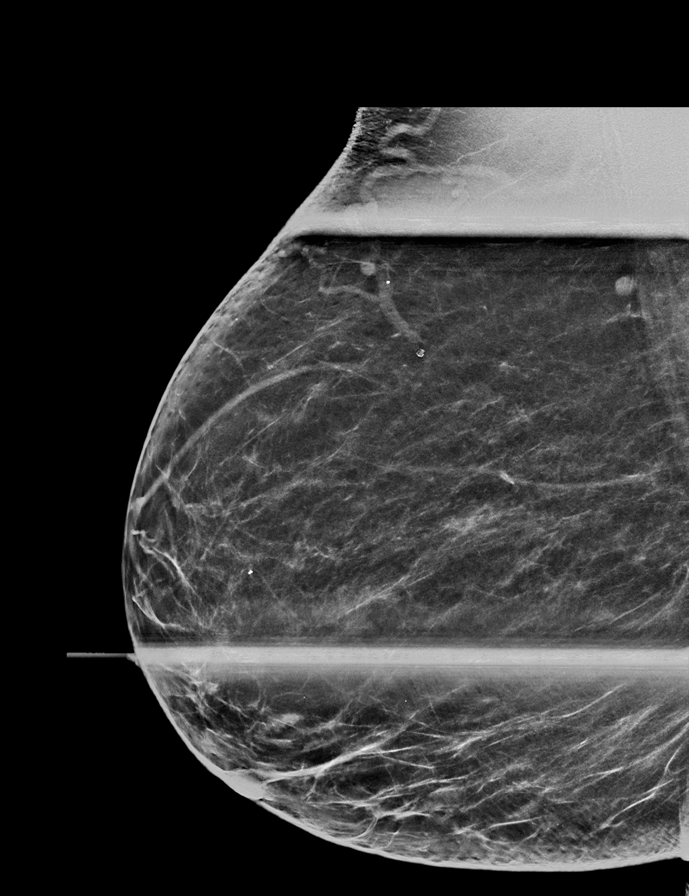

[R MLO synth-2D (2 of 3)]
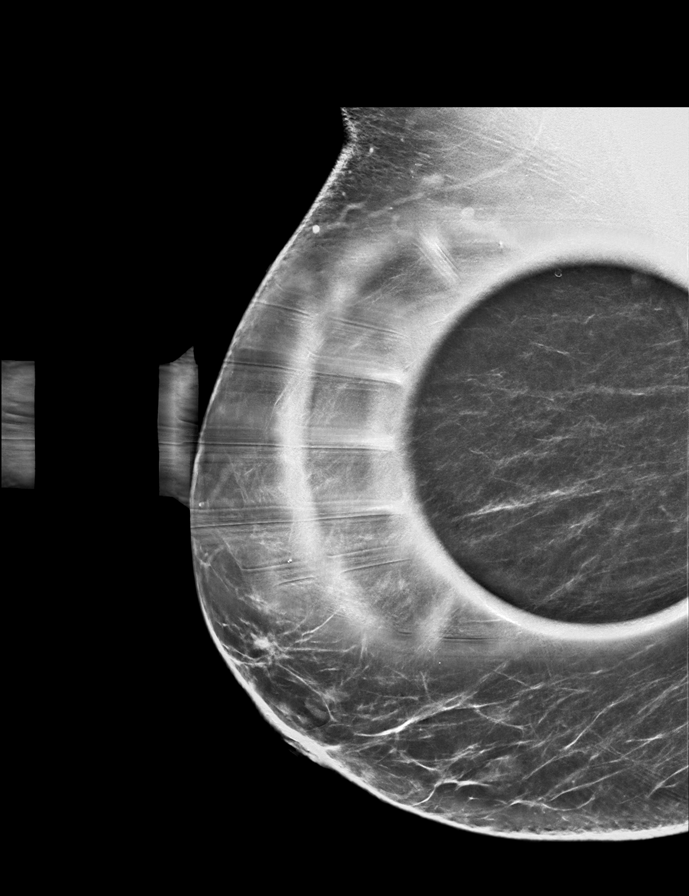

[R MLO synth-2D (3 of 3)]
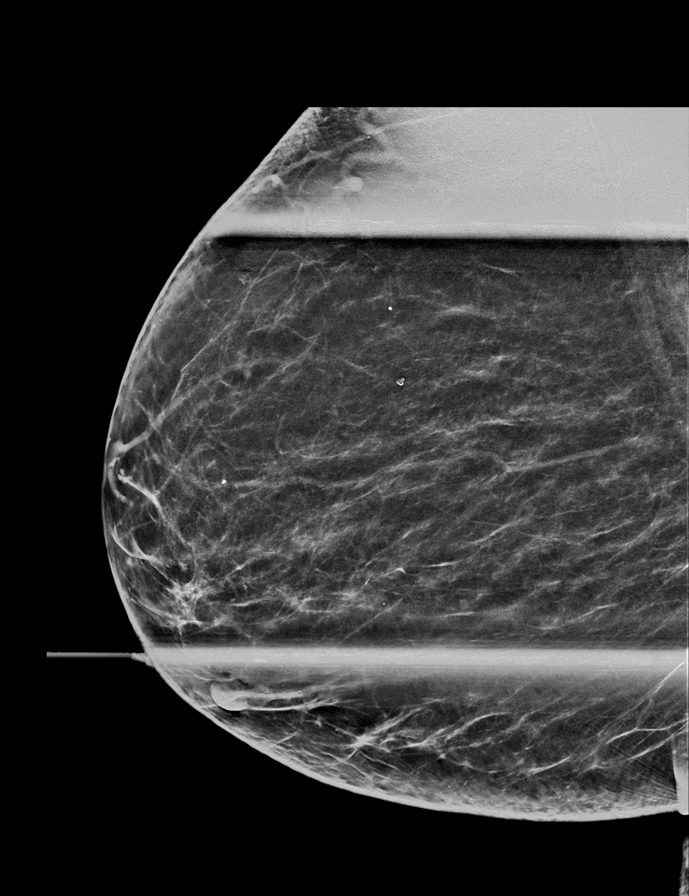

[R MLO tomo · 2 of 69 frames shown (1 of 3)]
[frame 23/69]
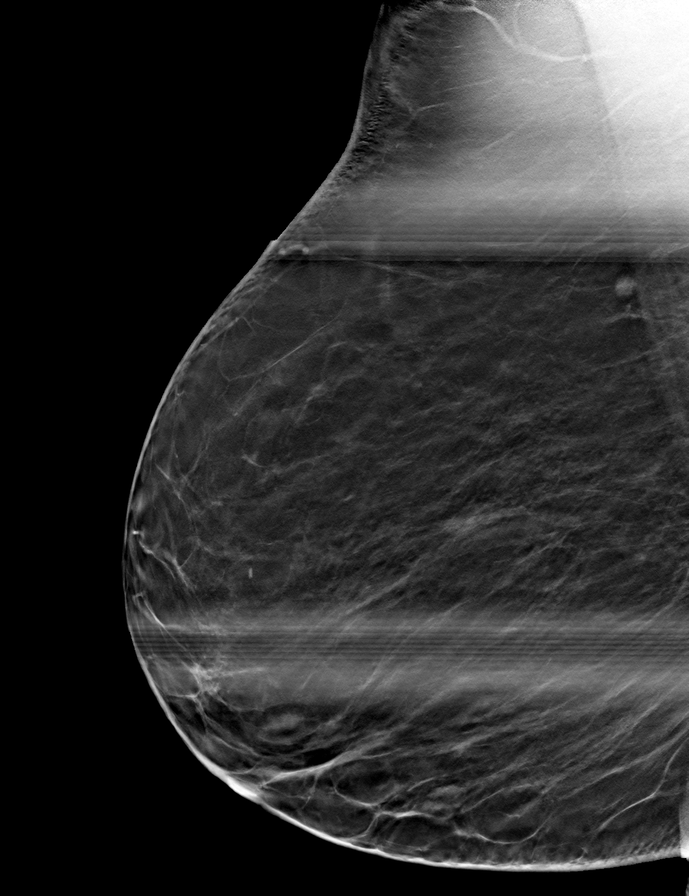
[frame 35/69]
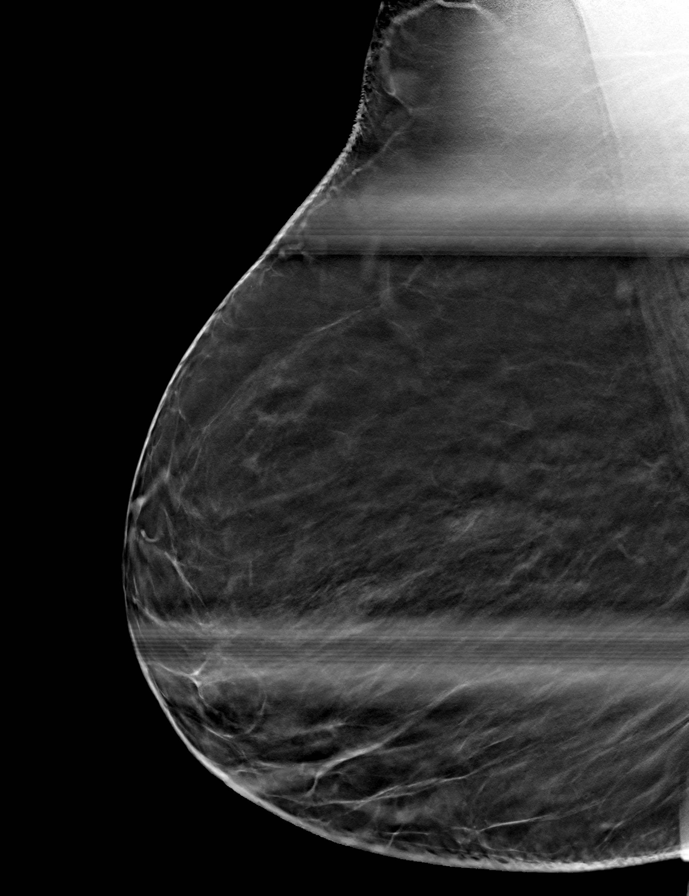

[R ML tomo · tomo slice 36/71.0]
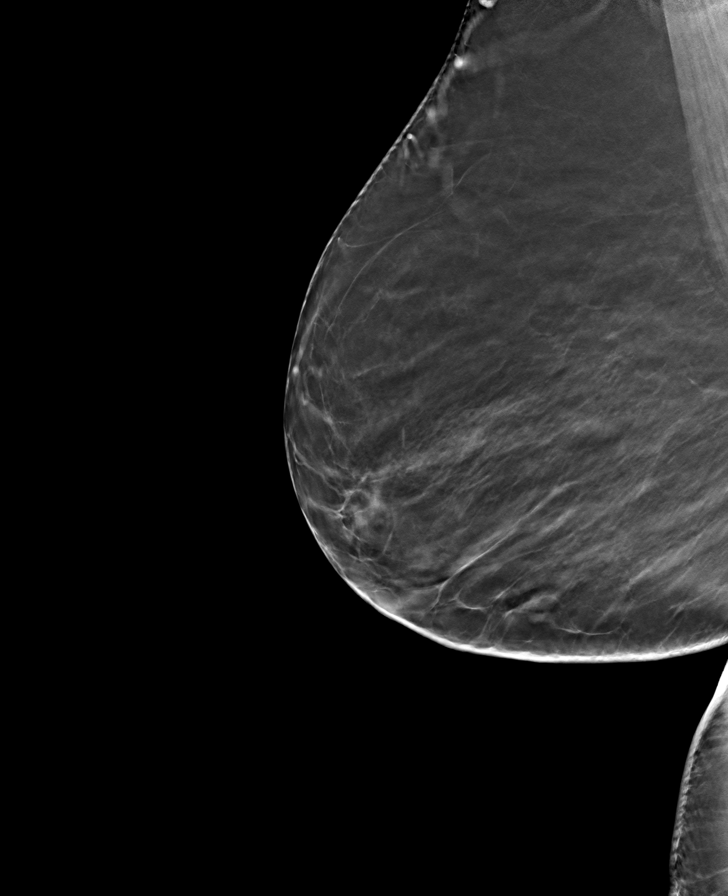

[R MLO tomo (2 of 3) · tomo slice 34/67.0]
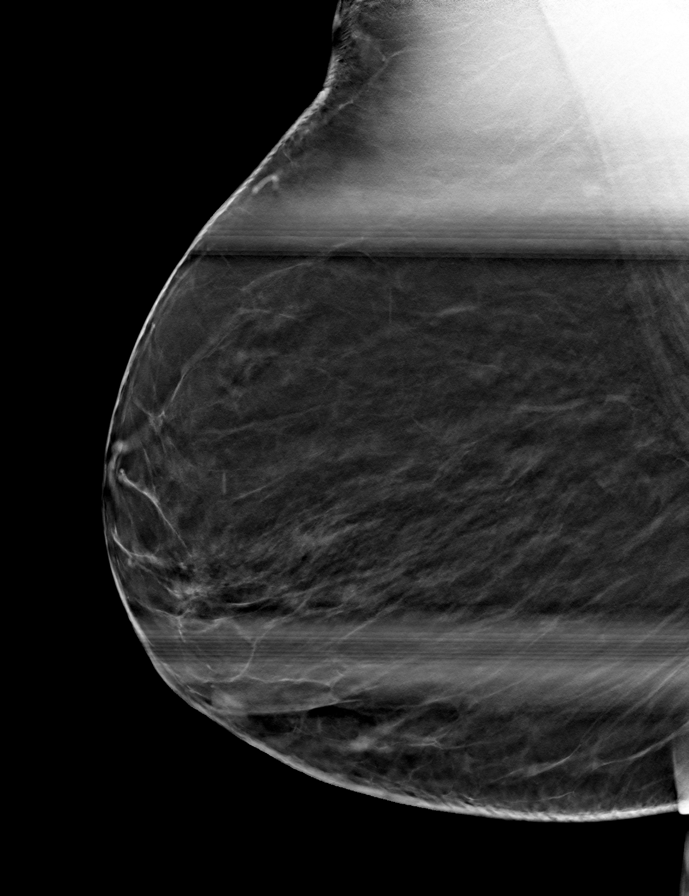

[R MLO tomo (3 of 3) · tomo slice 35/70.0]
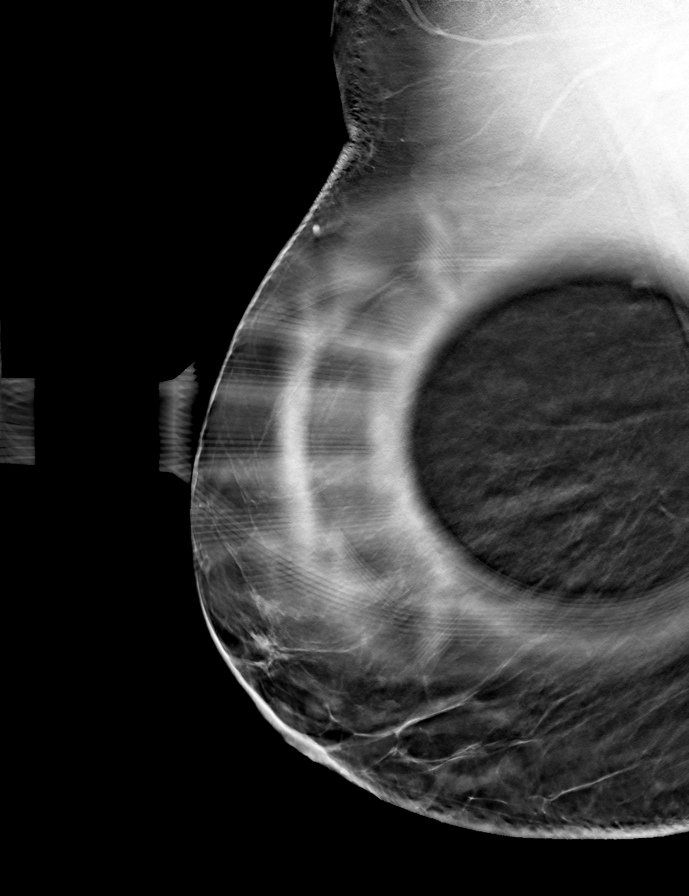

[9 of 24 positions shown; findings below may reference images not displayed]

ACR Breast Density Category c: The breast tissue is heterogeneously
dense, which may obscure small masses.
FINDINGS: Questioned asymmetry within the right breast resolved with
additional imaging compatible with dense overlapping fibroglandular
tissue. No suspicious findings on additional imaging.
IMPRESSION: No mammographic evidence for malignancy.

RECOMMENDATION:
Screening mammogram in one year.(Code:[id number])

I have discussed the findings and recommendations with the patient.
If applicable, a reminder letter will be sent to the patient
regarding the next appointment.

BI-RADS CATEGORY  1: Negative.
